# Patient Record
Sex: Female | Born: 1975 | Race: White | Hispanic: No | Marital: Married | Smoking: Former smoker
Health system: Southern US, Community
[De-identification: ages and names within clinical notes are randomized; demographics above are authoritative.]

## PROBLEM LIST (undated history)

## (undated) HISTORY — PX: HERNIA REPAIR: SHX51

## (undated) HISTORY — PX: CHOLECYSTECTOMY: SHX55

---

## 2016-04-29 DIAGNOSIS — N939 Abnormal uterine and vaginal bleeding, unspecified: Secondary | ICD-10-CM | POA: Diagnosis not present

## 2016-04-29 DIAGNOSIS — N930 Postcoital and contact bleeding: Secondary | ICD-10-CM | POA: Diagnosis not present

## 2016-04-29 DIAGNOSIS — Z01419 Encounter for gynecological examination (general) (routine) without abnormal findings: Secondary | ICD-10-CM | POA: Diagnosis not present

## 2016-05-16 DIAGNOSIS — Z23 Encounter for immunization: Secondary | ICD-10-CM | POA: Diagnosis not present

## 2016-05-29 DIAGNOSIS — N72 Inflammatory disease of cervix uteri: Secondary | ICD-10-CM | POA: Diagnosis not present

## 2016-05-29 DIAGNOSIS — N939 Abnormal uterine and vaginal bleeding, unspecified: Secondary | ICD-10-CM | POA: Diagnosis not present

## 2016-05-29 DIAGNOSIS — N930 Postcoital and contact bleeding: Secondary | ICD-10-CM | POA: Diagnosis not present

## 2016-07-06 DIAGNOSIS — J45998 Other asthma: Secondary | ICD-10-CM | POA: Diagnosis not present

## 2016-07-06 DIAGNOSIS — B9689 Other specified bacterial agents as the cause of diseases classified elsewhere: Secondary | ICD-10-CM | POA: Diagnosis not present

## 2016-07-06 DIAGNOSIS — J019 Acute sinusitis, unspecified: Secondary | ICD-10-CM | POA: Diagnosis not present

## 2016-08-23 ENCOUNTER — Emergency Department
Admission: EM | Admit: 2016-08-23 | Discharge: 2016-08-23 | Disposition: A | Discharge status: Home / Self Care | Payer: 59 | Attending: Emergency Medicine | Admitting: Emergency Medicine

## 2016-08-23 ENCOUNTER — Encounter: Payer: Self-pay | Admitting: Emergency Medicine

## 2016-08-23 DIAGNOSIS — Z87891 Personal history of nicotine dependence: Secondary | ICD-10-CM | POA: Diagnosis not present

## 2016-08-23 DIAGNOSIS — J09X2 Influenza due to identified novel influenza A virus with other respiratory manifestations: Secondary | ICD-10-CM | POA: Insufficient documentation

## 2016-08-23 DIAGNOSIS — J069 Acute upper respiratory infection, unspecified: Secondary | ICD-10-CM | POA: Diagnosis not present

## 2016-08-23 DIAGNOSIS — J101 Influenza due to other identified influenza virus with other respiratory manifestations: Secondary | ICD-10-CM

## 2016-08-23 DIAGNOSIS — R05 Cough: Secondary | ICD-10-CM | POA: Diagnosis present

## 2016-08-23 LAB — INFLUENZA PANEL BY PCR (TYPE A & B)
INFLBPCR: NEGATIVE
Influenza A By PCR: POSITIVE — AB

## 2016-08-23 MED ORDER — ONDANSETRON 4 MG PO TBDP: 4.0000 mg | ORAL_TABLET | Freq: Three times a day (TID) | ORAL | 0 refills | Status: AC | PRN

## 2016-08-23 MED ORDER — OSELTAMIVIR PHOSPHATE 75 MG PO CAPS: 75.0000 mg | ORAL_CAPSULE | Freq: Two times a day (BID) | ORAL | 0 refills | Status: AC

## 2016-08-23 NOTE — Discharge Instructions (Signed)
You have been seen in the emergency department today for upper respiratory symptoms. Your workup has resulted positive for influenza A. Please take your Tamiflu as prescribed, Zofran as needed for nausea. Please take Tylenol or Motrin every 6 hours as needed for fever or discomfort. Return to the emergency department for any worsening symptoms, trouble breathing, or any other symptom personally concerning to yourself.

## 2016-08-23 NOTE — ED Triage Notes (Signed)
Pt from home with headache, body aches, non productive cough, chest pain (after coughing) since yesterday. NAD noted.

## 2016-08-23 NOTE — ED Provider Notes (Signed)
[facility] Emergency Department Provider Note  Time seen: 7:53 AM  I have reviewed the triage vital signs and the nursing notes.   HISTORY  Chief Complaint Generalized Body Aches; Cough; Headache; and Chest Pain    HPI Haley Hinton is a 41 y.o. female with no past medical history who presents to the emergency department for 2 days of cough, congestion headache bodyaches. According to the patient for the past 48 hours she has been experiencing body aches and occasional reflux-type symptoms which she describes as a burning sensation in the upper abdomen, headache and cough and congestion. Patient denies fever but states she has been taking Tylenol/DayQuil/NyQuil for symptom relief.  History reviewed. No pertinent past medical history.  There are no active problems to display for this patient.   Past Surgical History:  Procedure Laterality Date  . CESAREAN SECTION    . CHOLECYSTECTOMY    . HERNIA REPAIR      Prior to Admission medications   Not on File    Allergies  Allergen Reactions  . Ortho Tri-Cyclen [Norgestimate-Eth Estradiol]     History reviewed. No pertinent family history.  Social History Social History  Substance Use Topics  . Smoking status: Former Smoker    Types: Cigarettes    Quit date: 07/27/2013  . Smokeless tobacco: Never Used  . Alcohol use 3.0 oz/week    5 Standard drinks or equivalent per week    Review of Systems Constitutional: Negative for fever. ENT: Positive congestion Cardiovascular: Negative for chest pain. Respiratory: Negative for shortness of breath, positive for cough. Gastrointestinal: Negative for abdominal pain. Negative for vomiting or diarrhea Musculoskeletal: Positive for body aches/muscle aches Neurological: Moderate headache. 10-point ROS otherwise negative.  ____________________________________________   PHYSICAL EXAM:  VITAL SIGNS: ED Triage Vitals  Enc Vitals Group     BP 08/23/16  0732 (!) 120/95     Pulse Rate 08/23/16 0732 82     Resp 08/23/16 0732 20     Temp 08/23/16 0732 98.5 F (36.9 C)     Temp Source 08/23/16 0732 Oral     SpO2 08/23/16 0732 98 %     Weight 08/23/16 0733 160 lb (72.6 kg)     Height 08/23/16 0733 5\' 7"  (1.702 m)     Head Circumference --      Peak Flow --      Pain Score 08/23/16 0733 4     Pain Loc --      Pain Edu? --      Excl. in GC? --     Constitutional: Alert. Well appearing and in no distress. Eyes: Normal exam ENT   Head: Normocephalic and atraumatic.   Mouth/Throat: Mucous membranes are moist. No pharyngeal erythema noted. Cardiovascular: Normal rate, regular rhythm. No murmur Respiratory: Normal respiratory effort without tachypnea nor retractions. Breath sounds are clear Gastrointestinal: Soft and nontender. No distention. Musculoskeletal: Nontender with normal range of motion in all extremities. Neurologic:  Normal speech and language. No gross focal neurologic deficits  Skin:  Skin is warm, dry and intact.  Psychiatric: Mood and affect are normal.   EKG reviewed and interpreted by myself shows normal sinus rhythm at 79 bpm. Narrow QRS, normal axis, normal intervals and no concerning ST changes. Reassuring EKG.   INITIAL IMPRESSION / ASSESSMENT AND PLAN / ED COURSE  Pertinent labs & imaging results that were available during my care of the patient were reviewed by me and considered in my medical decision making (see  chart for details).  The patient presents the emergency department with 2 days of cough, congestion, headache and body aches. No reported fever per the patient has been taking antipyretics. Overall the patient appears very well on examination, clear lung sounds, normal heart sounds. Nontender abdomen. No nuchal rigidity. We will check an influenza swab and closely monitor in the emergency department.  Influenza A is positive. I discussed the pros and cons of Tamiflu. We will prescribe Tamiflu and  Zofran for the patient. I discussed with care at home including Tylenol/Motrin and fluids.  ____________________________________________   FINAL CLINICAL IMPRESSION(S) / ED DIAGNOSES  Upper respiratory infection Influenza   Minna Antis, MD 08/23/16 [phone]

## 2016-08-23 NOTE — ED Notes (Signed)
Chest pain with coughing, non-productive cough that started in the past 24 hours. Denies any fevers. Did receive flu shot.  Sxs started in the past 24 hours.

## 2016-11-26 DIAGNOSIS — K219 Gastro-esophageal reflux disease without esophagitis: Secondary | ICD-10-CM | POA: Diagnosis not present

## 2016-11-26 DIAGNOSIS — Z0000 Encounter for general adult medical examination without abnormal findings: Secondary | ICD-10-CM | POA: Diagnosis not present

## 2016-11-26 DIAGNOSIS — R5383 Other fatigue: Secondary | ICD-10-CM | POA: Diagnosis not present

## 2016-11-27 DIAGNOSIS — Z0000 Encounter for general adult medical examination without abnormal findings: Secondary | ICD-10-CM | POA: Diagnosis not present

## 2017-01-11 DIAGNOSIS — K219 Gastro-esophageal reflux disease without esophagitis: Secondary | ICD-10-CM | POA: Diagnosis not present

## 2017-03-09 DIAGNOSIS — K219 Gastro-esophageal reflux disease without esophagitis: Secondary | ICD-10-CM | POA: Diagnosis not present

## 2017-03-15 DIAGNOSIS — I499 Cardiac arrhythmia, unspecified: Secondary | ICD-10-CM | POA: Diagnosis not present

## 2017-03-15 DIAGNOSIS — R42 Dizziness and giddiness: Secondary | ICD-10-CM | POA: Diagnosis not present

## 2017-03-15 DIAGNOSIS — R002 Palpitations: Secondary | ICD-10-CM | POA: Diagnosis not present

## 2017-03-23 ENCOUNTER — Other Ambulatory Visit: Payer: Self-pay | Admitting: Obstetrics and Gynecology

## 2017-03-23 DIAGNOSIS — N930 Postcoital and contact bleeding: Secondary | ICD-10-CM | POA: Diagnosis not present

## 2017-03-23 DIAGNOSIS — N941 Unspecified dyspareunia: Secondary | ICD-10-CM | POA: Diagnosis not present

## 2017-03-23 DIAGNOSIS — Z01419 Encounter for gynecological examination (general) (routine) without abnormal findings: Secondary | ICD-10-CM | POA: Diagnosis not present

## 2017-03-23 DIAGNOSIS — R002 Palpitations: Secondary | ICD-10-CM | POA: Diagnosis not present

## 2017-03-23 DIAGNOSIS — Z1231 Encounter for screening mammogram for malignant neoplasm of breast: Secondary | ICD-10-CM

## 2017-04-02 DIAGNOSIS — R002 Palpitations: Secondary | ICD-10-CM | POA: Diagnosis not present

## 2017-04-08 ENCOUNTER — Ambulatory Visit
Admission: RE | Admit: 2017-04-08 | Discharge: 2017-04-08 | Disposition: A | Discharge status: Home / Self Care | Payer: 59 | Source: Ambulatory Visit | Attending: Obstetrics and Gynecology | Admitting: Obstetrics and Gynecology

## 2017-04-08 DIAGNOSIS — Z1231 Encounter for screening mammogram for malignant neoplasm of breast: Secondary | ICD-10-CM | POA: Insufficient documentation

## 2017-04-12 ENCOUNTER — Other Ambulatory Visit: Payer: Self-pay | Admitting: Cardiology

## 2017-04-12 ENCOUNTER — Other Ambulatory Visit
Admission: RE | Admit: 2017-04-12 | Discharge: 2017-04-12 | Disposition: A | Discharge status: Home / Self Care | Payer: 59 | Source: Ambulatory Visit | Attending: Cardiology | Admitting: Cardiology

## 2017-04-12 DIAGNOSIS — R0789 Other chest pain: Secondary | ICD-10-CM | POA: Diagnosis not present

## 2017-04-12 DIAGNOSIS — R002 Palpitations: Secondary | ICD-10-CM | POA: Diagnosis not present

## 2017-04-12 LAB — TROPONIN I

## 2017-04-26 DIAGNOSIS — R0789 Other chest pain: Secondary | ICD-10-CM | POA: Diagnosis not present

## 2017-04-29 ENCOUNTER — Ambulatory Visit (HOSPITAL_COMMUNITY): Payer: 59

## 2017-05-11 DIAGNOSIS — Z79899 Other long term (current) drug therapy: Secondary | ICD-10-CM | POA: Diagnosis not present

## 2017-05-11 DIAGNOSIS — Z87891 Personal history of nicotine dependence: Secondary | ICD-10-CM | POA: Diagnosis not present

## 2017-05-11 DIAGNOSIS — I493 Ventricular premature depolarization: Secondary | ICD-10-CM | POA: Diagnosis not present

## 2017-05-11 DIAGNOSIS — Z23 Encounter for immunization: Secondary | ICD-10-CM | POA: Diagnosis not present

## 2017-06-01 DIAGNOSIS — K219 Gastro-esophageal reflux disease without esophagitis: Secondary | ICD-10-CM | POA: Diagnosis not present

## 2017-06-01 DIAGNOSIS — I493 Ventricular premature depolarization: Secondary | ICD-10-CM | POA: Diagnosis not present

## 2019-02-14 IMAGING — MG MM DIGITAL SCREENING BILAT W/ TOMO W/ CAD
8 of 12 series · 8 of 28 positions shown · non-contrast
Comparison: None.

CLINICAL DATA: Screening. Baseline.

EXAM:
2D DIGITAL SCREENING BILATERAL MAMMOGRAM WITH CAD AND ADJUNCT TOMO

[L MLO synth-2D]
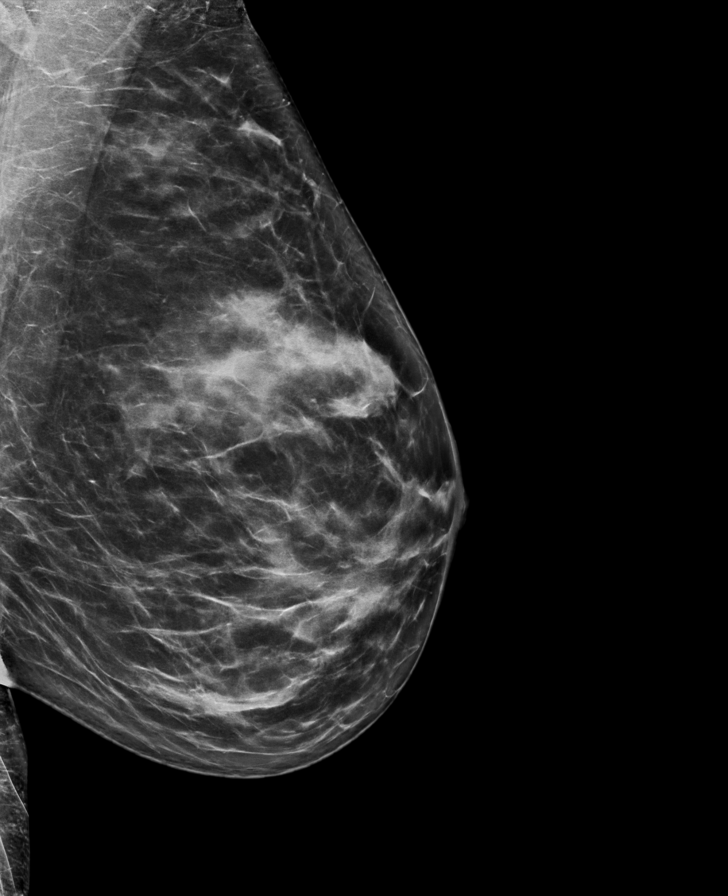

[L MLO]
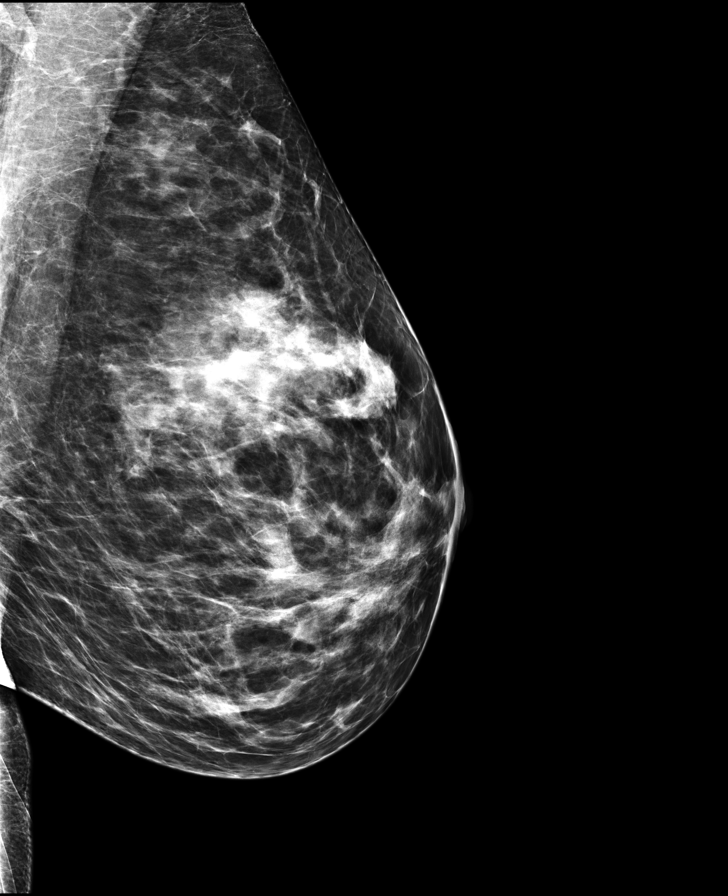

[L CC]
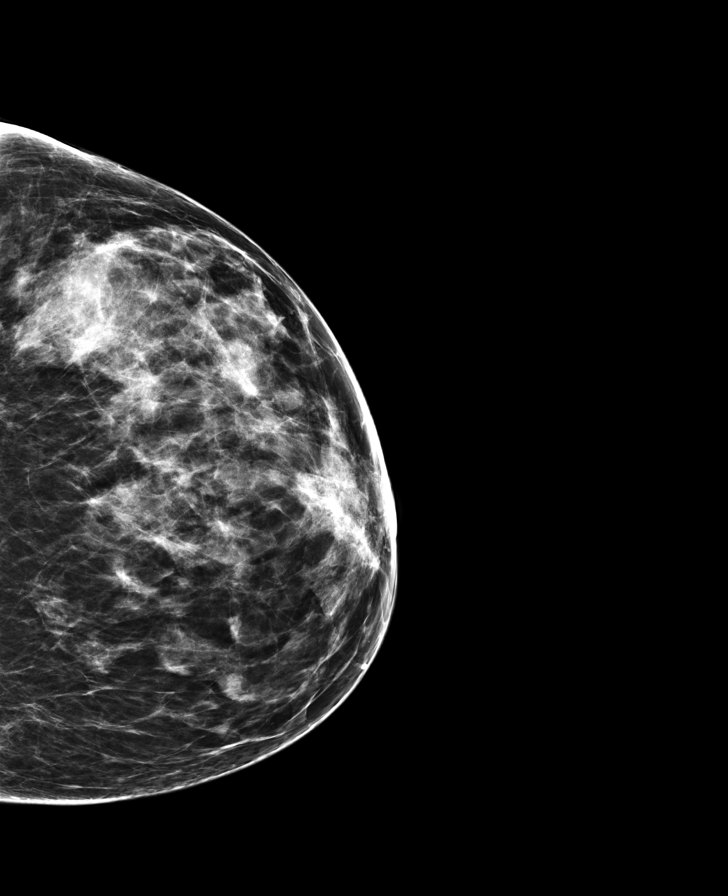

[L CC synth-2D]
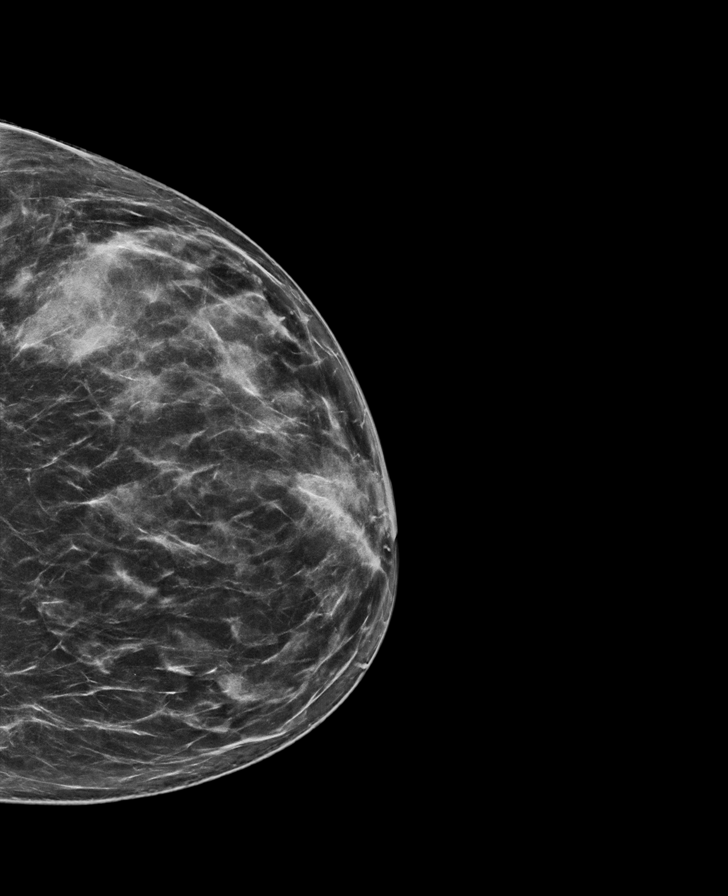

[R MLO]
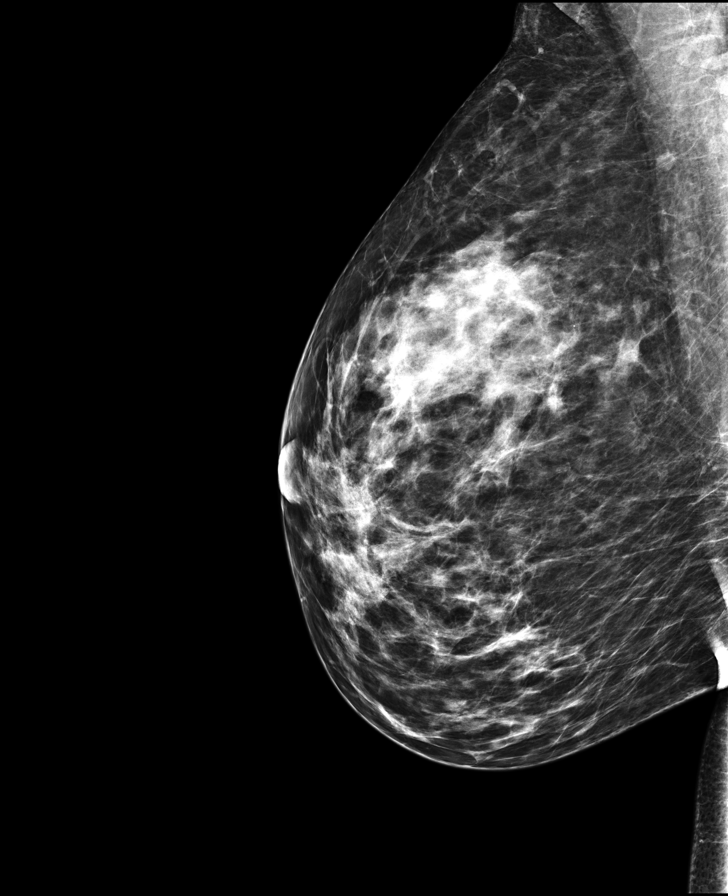

[R CC synth-2D]
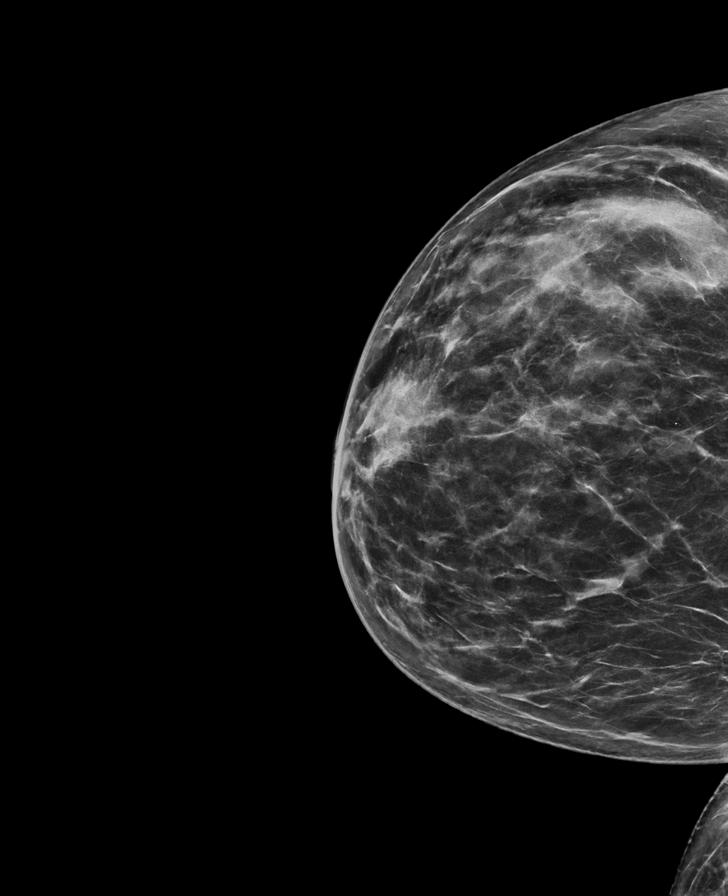

[R CC]
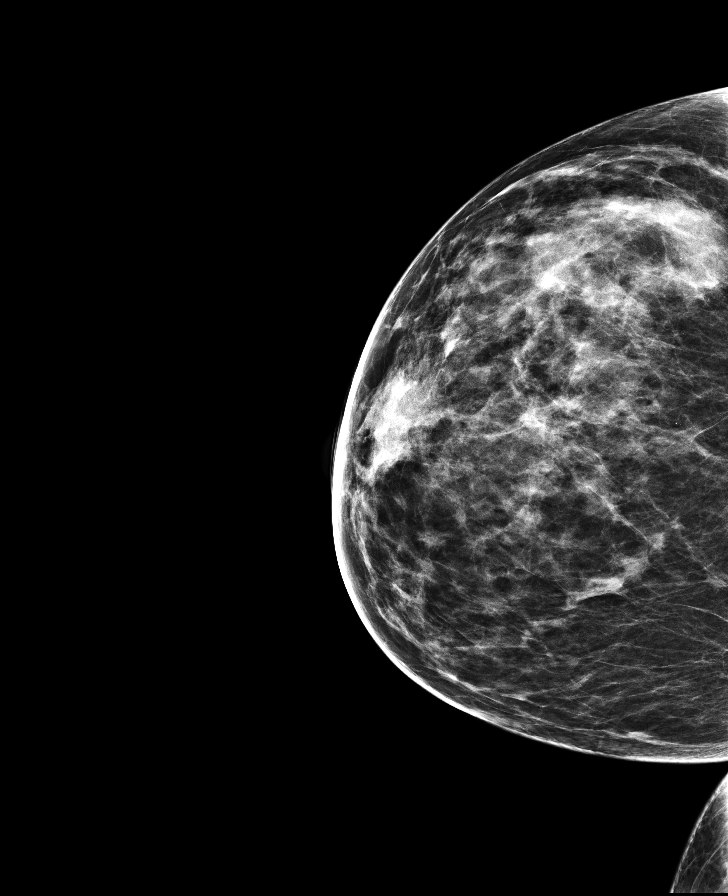

[R MLO synth-2D]
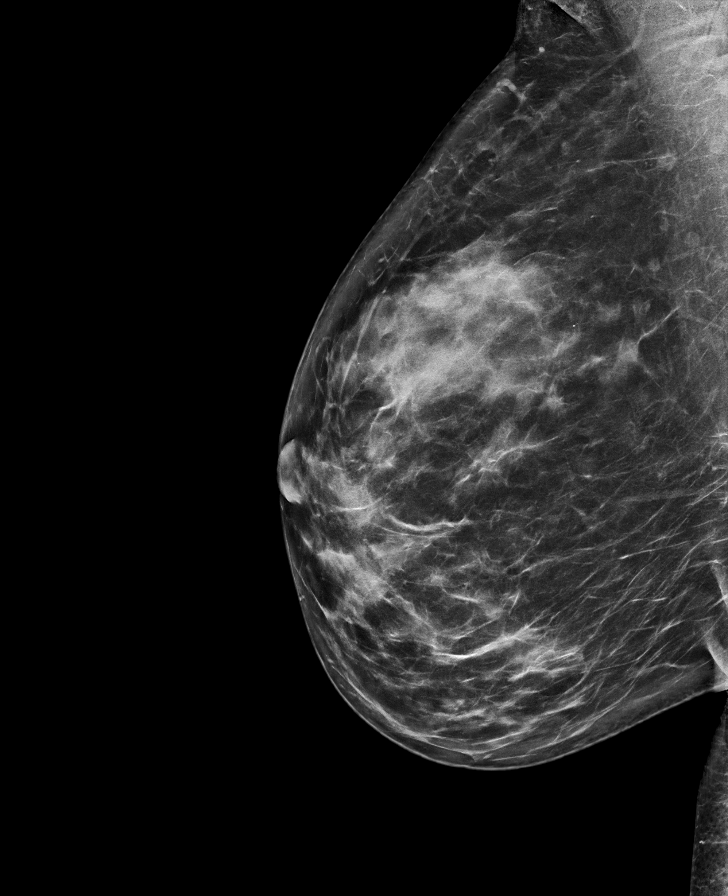

[8 of 28 positions shown; findings below may reference images not displayed]

ACR Breast Density Category c: The breast tissue is heterogeneously
dense, which may obscure small masses
FINDINGS: There are no findings suspicious for malignancy. Images were
processed with CAD.
IMPRESSION: No mammographic evidence of malignancy. A result letter of this
screening mammogram will be mailed directly to the patient.

RECOMMENDATION:
Screening mammogram in one year. (Code:GX-[id number])

BI-RADS CATEGORY  1: Negative.
